# Patient Record
Sex: Female | Born: 1985 | Hispanic: No | Marital: Married | Smoking: Former smoker
Health system: Southern US, Community
[De-identification: ages and names within clinical notes are randomized; demographics above are authoritative.]

## PROBLEM LIST (undated history)

## (undated) DIAGNOSIS — K219 Gastro-esophageal reflux disease without esophagitis: Secondary | ICD-10-CM

## (undated) DIAGNOSIS — F419 Anxiety disorder, unspecified: Secondary | ICD-10-CM

## (undated) DIAGNOSIS — E119 Type 2 diabetes mellitus without complications: Secondary | ICD-10-CM

## (undated) DIAGNOSIS — F909 Attention-deficit hyperactivity disorder, unspecified type: Secondary | ICD-10-CM

## (undated) HISTORY — DX: Anxiety disorder, unspecified: F41.9

## (undated) HISTORY — PX: TUBAL LIGATION: SHX77

## (undated) HISTORY — DX: Attention-deficit hyperactivity disorder, unspecified type: F90.9

## (undated) HISTORY — DX: Gastro-esophageal reflux disease without esophagitis: K21.9

---

## 2012-05-02 HISTORY — PX: GASTRIC BYPASS: SHX52

## 2016-05-28 ENCOUNTER — Encounter: Payer: Self-pay | Admitting: *Deleted

## 2016-05-28 ENCOUNTER — Emergency Department
Admission: EM | Admit: 2016-05-28 | Discharge: 2016-05-29 | Disposition: A | Discharge status: Home / Self Care | Attending: Emergency Medicine | Admitting: Emergency Medicine

## 2016-05-28 DIAGNOSIS — R45851 Suicidal ideations: Secondary | ICD-10-CM

## 2016-05-28 DIAGNOSIS — F1910 Other psychoactive substance abuse, uncomplicated: Secondary | ICD-10-CM

## 2016-05-28 DIAGNOSIS — Z79899 Other long term (current) drug therapy: Secondary | ICD-10-CM | POA: Insufficient documentation

## 2016-05-28 DIAGNOSIS — R258 Other abnormal involuntary movements: Secondary | ICD-10-CM | POA: Diagnosis not present

## 2016-05-28 DIAGNOSIS — E119 Type 2 diabetes mellitus without complications: Secondary | ICD-10-CM | POA: Diagnosis not present

## 2016-05-28 HISTORY — DX: Type 2 diabetes mellitus without complications: E11.9

## 2016-05-28 LAB — COMPREHENSIVE METABOLIC PANEL
ALK PHOS: 50 U/L (ref 38–126)
ALT: 23 U/L (ref 14–54)
ANION GAP: 6 (ref 5–15)
AST: 25 U/L (ref 15–41)
Albumin: 4.6 g/dL (ref 3.5–5.0)
BILIRUBIN TOTAL: 0.4 mg/dL (ref 0.3–1.2)
BUN: 13 mg/dL (ref 6–20)
CALCIUM: 9 mg/dL (ref 8.9–10.3)
CO2: 27 mmol/L (ref 22–32)
Chloride: 104 mmol/L (ref 101–111)
Creatinine, Ser: 0.75 mg/dL (ref 0.44–1.00)
Glucose, Bld: 115 mg/dL — ABNORMAL HIGH (ref 65–99)
POTASSIUM: 3.5 mmol/L (ref 3.5–5.1)
Sodium: 137 mmol/L (ref 135–145)
TOTAL PROTEIN: 7.4 g/dL (ref 6.5–8.1)

## 2016-05-28 LAB — CBC
HCT: 40.2 % (ref 35.0–47.0)
HEMOGLOBIN: 13.5 g/dL (ref 12.0–16.0)
MCH: 30.5 pg (ref 26.0–34.0)
MCHC: 33.6 g/dL (ref 32.0–36.0)
MCV: 90.9 fL (ref 80.0–100.0)
PLATELETS: 181 10*3/uL (ref 150–440)
RBC: 4.43 MIL/uL (ref 3.80–5.20)
RDW: 13.1 % (ref 11.5–14.5)
WBC: 5.7 10*3/uL (ref 3.6–11.0)

## 2016-05-28 LAB — URINE DRUG SCREEN, QUALITATIVE (ARMC ONLY)
Amphetamines, Ur Screen: NOT DETECTED
BARBITURATES, UR SCREEN: NOT DETECTED
Benzodiazepine, Ur Scrn: POSITIVE — AB
CANNABINOID 50 NG, UR ~~LOC~~: NOT DETECTED
COCAINE METABOLITE, UR ~~LOC~~: NOT DETECTED
MDMA (ECSTASY) UR SCREEN: NOT DETECTED
METHADONE SCREEN, URINE: NOT DETECTED
Opiate, Ur Screen: POSITIVE — AB
Phencyclidine (PCP) Ur S: NOT DETECTED
TRICYCLIC, UR SCREEN: NOT DETECTED

## 2016-05-28 LAB — POCT PREGNANCY, URINE: PREG TEST UR: NEGATIVE

## 2016-05-28 LAB — ETHANOL
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
## 2016-05-28 NOTE — ED Notes (Signed)

## 2016-05-28 NOTE — ED Notes (Signed)
Report given to Ann, RN.
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
## 2016-05-28 NOTE — ED Notes (Signed)
Calvin, TTS at bedside at this time.  
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
## 2016-05-28 NOTE — ED Notes (Signed)
IVC'd by mother Carrier [phone]) mother came to ED, daughter did not want to see her. Officer did advise patient not have any visitors at this time.

## 2016-05-28 NOTE — ED Notes (Signed)

## 2016-05-28 NOTE — ED Notes (Signed)
Report was received from Dorise HissElizabeth C., RN; Pt. Verbalizes  complaints of being depressed and sad; and tearful;  Also verbalizes having S.I; with a plan to "slit her throat.". Denies having Hi. Continue to monitor with 15 min. Monitoring.

## 2016-05-28 NOTE — ED Notes (Signed)
Report called to Margaret, RN in ED BHU 

## 2016-05-28 NOTE — ED Provider Notes (Signed)
[facility] Emergency Department Provider Note        Time seen: ----------------------------------------- 5:44 PM on 05/28/2016 -----------------------------------------    I have reviewed the triage vital signs and the nursing notes.   HISTORY  Chief Complaint IVC    HPI Yolanda Mccarthy is a 31 y.o. female who presents to the ER under involuntary commitment.IVC paperwork states the patient was threatening to cut her throat but the patient is denying this. There is a history of drug abuse both in the family and with the patient. Patient reports family gave her clonazepam and hydrocodone today. Patient states she fell and sustained cuts to her right arm and right leg. Family states she was trying to hurt herself.   Past Medical History:  Diagnosis Date  . Diabetes mellitus without complication (HCC)     There are no active problems to display for this patient.   Past Surgical History:  Procedure Laterality Date  . TUBAL LIGATION      Allergies Patient has no allergy information on record.  Social History Social History  Substance Use Topics  . Smoking status: Not on file  . Smokeless tobacco: Not on file  . Alcohol use Not on file    Review of Systems Constitutional: Negative for fever. Cardiovascular: Negative for chest pain. Respiratory: Negative for shortness of breath. Gastrointestinal: Negative for abdominal pain, vomiting and diarrhea. Genitourinary: Negative for dysuria. Musculoskeletal: Negative for back pain. Skin: Positive for scratches to right arm and right leg Neurological: Negative for headaches, focal weakness or numbness. Psychiatric: Positive for drug abuse, negative for suicidal or homicidal ideations  10-point ROS otherwise negative.  ____________________________________________   PHYSICAL EXAM:  VITAL SIGNS: ED Triage Vitals [05/28/16 1708]  Enc Vitals Group     BP 129/86     Pulse Rate (!) 102   Resp 18     Temp 98.2 F (36.8 C)     Temp Source Oral     SpO2 100 %     Weight 145 lb (65.8 kg)     Height 5\' 4"  (1.626 m)     Head Circumference      Peak Flow      Pain Score      Pain Loc      Pain Edu?      Excl. in GC?     Constitutional: Alert and oriented. Well appearing and in no distress. Eyes: Conjunctivae are normal. PERRL. Normal extraocular movements. ENT   Head: Normocephalic and atraumatic.   Nose: No congestion/rhinnorhea.   Mouth/Throat: Mucous membranes are moist.   Neck: No stridor. Cardiovascular: Normal rate, regular rhythm. No murmurs, rubs, or gallops. Respiratory: Normal respiratory effort without tachypnea nor retractions. Breath sounds are clear and equal bilaterally. No wheezes/rales/rhonchi. Gastrointestinal: Soft and nontender. Normal bowel sounds Musculoskeletal: Nontender with normal range of motion in all extremities. No lower extremity tenderness nor edema. Neurologic:  Normal speech and language. No gross focal neurologic deficits are appreciated.  Skin:  Multiple long scratches are appreciated on the right arm anteriorly and right leg laterally on the thigh Psychiatric: Mood and affect are normal. ____________________________________________  ED COURSE:  Pertinent labs & imaging results that were available during my care of the patient were reviewed by me and considered in my medical decision making (see chart for details). Patient's no distress, we will assess with labs and consult psychiatry. Under involuntary commitment   Procedures ____________________________________________   LABS (pertinent positives/negatives)  Labs Reviewed  COMPREHENSIVE METABOLIC PANEL -  Abnormal; Notable for the following:       Result Value   Glucose, Bld 115 (*)    All other components within normal limits  URINE DRUG SCREEN, QUALITATIVE ([facility] ONLY) - Abnormal; Notable for the following:    Opiate, Ur Screen POSITIVE (*)    Benzodiazepine,  Ur Scrn POSITIVE (*)    All other components within normal limits  ETHANOL  CBC  POCT PREGNANCY, URINE   ____________________________________________  FINAL ASSESSMENT AND PLAN  Involuntary commitment, suicidal ideation, polysubstance abuse  Plan: Patient with labs as dictated above. Patient remains medically stable for psychiatric evaluation and disposition.   Emily Filbert, MD   Note: This note was generated in part or whole with voice recognition software. Voice recognition is usually quite accurate but there are transcription errors that can and very often do occur. I apologize for any typographical errors that were not detected and corrected.     Emily Filbert, MD 05/28/16 [phone]

## 2016-05-29 NOTE — ED Notes (Signed)
Patient having SOC at present.

## 2016-05-29 NOTE — ED Provider Notes (Signed)
-----------------------------------------   8:34 AM on 05/29/2016 -----------------------------------------   Blood pressure 108/73, pulse 90, temperature 98.1 F (36.7 C), temperature source Oral, resp. rate 18, height 5\' 4"  (1.626 m), weight 145 lb (65.8 kg), last menstrual period 04/23/2016, SpO2 100 %.  The patient had no acute events since last update.  Calm and cooperative at this time.  Disposition is pending Psychiatry/Behavioral Medicine team recommendations.     Rebecka ApleyAllison P Webster, MD 05/29/16 [phone]

## 2016-05-29 NOTE — ED Notes (Signed)
Patient visited with boyfriend. Visit went well. Patient anticipating discharge today. Maintained on 15 minute checks and observation by security camera for safety.

## 2016-05-29 NOTE — ED Notes (Signed)
Patient discharged ambulatory to self. She denies SI or HI. Discharge instructions reviewed with staff, she verbalizes understanding. Patient received copy of DC plan and all personal belongings. 

## 2016-05-29 NOTE — ED Notes (Signed)
Patient sitting in common area, in NAD. Maintained on 15 minute checks and observation by security camera for safety.

## 2016-05-29 NOTE — ED Notes (Signed)

## 2016-05-29 NOTE — ED Provider Notes (Signed)
-----------------------------------------   2:24 PM on 05/29/2016 -----------------------------------------  The patient has been seen by psychiatry on-call they believe the patient is safe for discharge home. Patient's medical workup has been largely nonrevealing besides opiate and benzodiazepine positive urine toxicology. Patient will be discharged home with RTS follow-up.   Minna AntisKevin Jameika Kinn, MD 05/29/16 [phone]
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
## 2016-05-29 NOTE — ED Notes (Signed)
Patient in common area, in NAD. Spoke with boyfriend on the phone.

## 2016-05-29 NOTE — ED Notes (Signed)
Patient resting quietly in room. No noted distress or abnormal behaviors noted. Will continue 15 minute checks and observation by security camera for safety. 

## 2016-05-29 NOTE — Discharge Instructions (Signed)
You have been seen in the emergency department for a  psychiatric concern. You have been evaluated both medically as well as psychiatrically. Please follow-up with your outpatient resources provided. Return to the emergency department for any worsening symptoms, or any thoughts of hurting yourself or anyone else so that we may attempt to help you. 

## 2016-09-12 ENCOUNTER — Encounter: Payer: Self-pay | Admitting: Family Medicine

## 2016-09-12 ENCOUNTER — Ambulatory Visit (INDEPENDENT_AMBULATORY_CARE_PROVIDER_SITE_OTHER): Admitting: Family Medicine

## 2016-09-12 VITALS — BP 120/82 | HR 88 | Ht 64.0 in | Wt 138.0 lb

## 2016-09-12 DIAGNOSIS — R600 Localized edema: Secondary | ICD-10-CM | POA: Diagnosis not present

## 2016-09-12 DIAGNOSIS — Z7689 Persons encountering health services in other specified circumstances: Secondary | ICD-10-CM

## 2016-09-12 DIAGNOSIS — L237 Allergic contact dermatitis due to plants, except food: Secondary | ICD-10-CM

## 2016-09-12 DIAGNOSIS — R030 Elevated blood-pressure reading, without diagnosis of hypertension: Secondary | ICD-10-CM | POA: Diagnosis not present

## 2016-09-12 DIAGNOSIS — R102 Pelvic and perineal pain: Secondary | ICD-10-CM

## 2016-09-12 DIAGNOSIS — G8929 Other chronic pain: Secondary | ICD-10-CM

## 2016-09-12 MED ORDER — PREDNISONE 10 MG PO TABS: ORAL_TABLET | ORAL | 1 refills | Status: AC

## 2016-09-12 NOTE — Patient Instructions (Signed)

## 2016-09-12 NOTE — Progress Notes (Signed)
Name: Yolanda Mccarthy   MRN: [id number]    DOB: 16-Jan-1986   Date:09/12/2016       Progress Note  Subjective  Chief Complaint  Chief Complaint  Patient presents with  . Establish Care    transferred from [facility] to [facility] because it is closer to [place]  . Hypertension    never been dx with hypertension but has had some high readings recently- 150/ 90  . Rash    poison ivey around ankles, hands, arms- "weeding in yard"    Edema/ in am/pm... Not alleviated by elevation... Purple ... Swelling 2-3 x aday... No relief with Diurex   Hypertension  This is a new problem. The current episode started more than 1 month ago. The problem has been waxing and waning since onset. Associated symptoms include peripheral edema. Pertinent negatives include no anxiety, blurred vision, chest pain, headaches, malaise/fatigue, neck pain, orthopnea, palpitations, PND, shortness of breath or sweats. Agents associated with hypertension include amphetamines. Past treatments include lifestyle changes (decrease sodium). The current treatment provides moderate improvement. There are no compliance problems.  There is no history of angina, kidney disease, CAD/MI, CVA, heart failure, left ventricular hypertrophy, PVD or retinopathy. There is no history of chronic renal disease, coarctation of the aorta, a hypertension causing med, renovascular disease or a thyroid problem.  Rash  This is a new problem. The current episode started 1 to 4 weeks ago. The problem has been waxing and waning since onset. The affected locations include the left ankle, right hand, right wrist, left wrist, left hand and right ankle. The rash is characterized by itchiness. She was exposed to plant contact. Pertinent negatives include no anorexia, congestion, cough, diarrhea, eye pain, facial edema, fatigue, fever, joint pain, nail changes, rhinorrhea, shortness of breath, sore throat or vomiting. Past treatments include topical steroids and anti-itch  cream. The treatment provided mild relief. There is no history of allergies, asthma, eczema or varicella.    No problem-specific Assessment & Plan notes found for this encounter.   Past Medical History:  Diagnosis Date  . Anxiety   . Attention deficit hyperactivity disorder (ADHD)   . Diabetes mellitus without complication (HCC)   . GERD (gastroesophageal reflux disease)     Past Surgical History:  Procedure Laterality Date  . CESAREAN SECTION     x 2  . GASTRIC BYPASS  2014  . TUBAL LIGATION      Family History  Problem Relation Age of Onset  . Diabetes Mother   . Stroke Mother   . Diabetes Father   . Hypertension Father   . Cancer Sister   . Diabetes Sister   . Hypertension Sister   . Diabetes Brother   . Cancer Maternal Grandmother   . Diabetes Maternal Grandmother   . Hypertension Maternal Grandmother   . Diabetes Maternal Grandfather   . Diabetes Paternal Grandmother   . Diabetes Paternal Grandfather     Social History   Social History  . Marital status: Married    Spouse name: N/A  . Number of children: N/A  . Years of education: N/A   Occupational History  . Not on file.   Social History Main Topics  . Smoking status: Former Smoker    Types: Cigarettes  . Smokeless tobacco: Never Used  . Alcohol use Yes  . Drug use: No  . Sexual activity: No   Other Topics Concern  . Not on file   Social History Narrative  . No narrative on file  Allergies  Allergen Reactions  . Oxycodone     sedation  . Tramadol     Causes seizures    Outpatient Medications Prior to Visit  Medication Sig Dispense Refill  . ondansetron (ZOFRAN) 8 MG tablet Take by mouth every 8 (eight) hours as needed for nausea or vomiting. Gastric bypass surgery    . pantoprazole (PROTONIX) 40 MG tablet Take 40 mg by mouth 2 (two) times daily.    . promethazine (PHENERGAN) 25 MG tablet Take 25 mg by mouth every 6 (six) hours as needed for nausea or vomiting.     No  facility-administered medications prior to visit.     Review of Systems  Constitutional: Negative for chills, diaphoresis, fatigue, fever, malaise/fatigue and weight loss.  HENT: Negative for congestion, ear discharge, ear pain, rhinorrhea and sore throat.   Eyes: Negative for blurred vision and pain.  Respiratory: Negative for cough, sputum production, shortness of breath and wheezing.   Cardiovascular: Positive for leg swelling. Negative for chest pain, palpitations, orthopnea and PND.  Gastrointestinal: Negative for abdominal pain, anorexia, blood in stool, constipation, diarrhea, heartburn, melena, nausea and vomiting.  Genitourinary: Negative for dysuria, frequency, hematuria and urgency.  Musculoskeletal: Negative for back pain, joint pain, myalgias and neck pain.  Skin: Positive for itching and rash. Negative for nail changes.  Neurological: Negative for dizziness, tingling, sensory change, focal weakness, weakness and headaches.  Endo/Heme/Allergies: Negative for environmental allergies and polydipsia. Does not bruise/bleed easily.  Psychiatric/Behavioral: Negative for depression and suicidal ideas. The patient is not nervous/anxious and does not have insomnia.      Objective  Vitals:   09/12/16 1400  BP: 120/82  Pulse: 88  Weight: 138 lb (62.6 kg)  Height: 5\' 4"  (1.626 m)    Physical Exam  Constitutional: She is well-developed, well-nourished, and in no distress. No distress.  HENT:  Head: Normocephalic and atraumatic.  Right Ear: External ear normal.  Left Ear: External ear normal.  Nose: Nose normal.  Mouth/Throat: Oropharynx is clear and moist.  Eyes: Conjunctivae and EOM are normal. Pupils are equal, round, and reactive to light. Right eye exhibits no discharge. Left eye exhibits no discharge.  Neck: Normal range of motion. Neck supple. No JVD present. No thyromegaly present.  Cardiovascular: Normal rate, regular rhythm, normal heart sounds and intact distal  pulses.  Exam reveals no gallop and no friction rub.   No murmur heard. Pulmonary/Chest: Effort normal and breath sounds normal. She has no wheezes. She has no rales.  Abdominal: Soft. Bowel sounds are normal. She exhibits no mass. There is no tenderness. There is no guarding.  Musculoskeletal: Normal range of motion. She exhibits no edema or deformity.  Lymphadenopathy:    She has no cervical adenopathy.  Neurological: She is alert. She has normal reflexes.  Skin: Skin is warm and dry. Rash noted. She is not diaphoretic. No erythema. No pallor.  Psychiatric: Mood and affect normal.  Nursing note and vitals reviewed.     Assessment & Plan  Problem List Items Addressed This Visit    None    Visit Diagnoses    Establishing care with new doctor, encounter for    -  Primary   Allergic contact dermatitis due to plants, except food       Chronic pelvic pain in female       Relevant Medications   clonazePAM (KLONOPIN) 1 MG tablet   predniSONE (DELTASONE) 10 MG tablet   Other Relevant Orders   US Transvaginal Non-OB  Localized edema       Elevated blood pressure reading       Relevant Orders   US Pelvis Complete   US Abdomen Complete      Meds ordered this encounter  Medications  . predniSONE (DELTASONE) 10 MG tablet    Sig: Taper 6,6,6,5,5,5,4,4,3,3,2,2,1,1    Dispense:  53 tablet    Refill:  1  I spent 30 minutes with this patient, More than 50% of that time was spent in face to face education, counseling and care coordination.    Dr. Hayden Rasmusseneanna Keandra [facility]  09/12/16

## 2016-09-20 ENCOUNTER — Ambulatory Visit
Admission: RE | Admit: 2016-09-20 | Discharge: 2016-09-20 | Disposition: A | Discharge status: Home / Self Care | Source: Ambulatory Visit | Attending: Family Medicine | Admitting: Family Medicine

## 2016-09-20 ENCOUNTER — Other Ambulatory Visit: Payer: Self-pay

## 2016-09-20 DIAGNOSIS — R030 Elevated blood-pressure reading, without diagnosis of hypertension: Secondary | ICD-10-CM | POA: Diagnosis not present

## 2016-09-20 DIAGNOSIS — R102 Pelvic and perineal pain: Secondary | ICD-10-CM | POA: Diagnosis present

## 2016-09-20 DIAGNOSIS — R938 Abnormal findings on diagnostic imaging of other specified body structures: Secondary | ICD-10-CM | POA: Diagnosis not present

## 2016-09-20 DIAGNOSIS — D739 Disease of spleen, unspecified: Secondary | ICD-10-CM | POA: Diagnosis not present

## 2016-09-20 DIAGNOSIS — N839 Noninflammatory disorder of ovary, fallopian tube and broad ligament, unspecified: Secondary | ICD-10-CM | POA: Insufficient documentation

## 2016-09-20 DIAGNOSIS — G8929 Other chronic pain: Secondary | ICD-10-CM | POA: Diagnosis not present

## 2016-09-22 ENCOUNTER — Encounter: Payer: Self-pay | Admitting: Family Medicine

## 2016-10-24 ENCOUNTER — Ambulatory Visit: Admitting: Family Medicine

## 2017-06-29 IMAGING — US US TRANSVAGINAL NON-OB
1 series · 13 of 25 positions shown · non-contrast
Comparison: No recent prior.

CLINICAL DATA: Chronic abdominal and pelvic pain.

EXAM:
TRANSABDOMINAL AND TRANSVAGINAL ULTRASOUND OF PELVIS
TECHNIQUE: Both transabdominal and transvaginal ultrasound examinations of the
pelvis were performed. Transabdominal technique was performed for
global imaging of the pelvis including uterus, ovaries, adnexal
regions, and pelvic cul-de-sac. It was necessary to proceed with
endovaginal exam following the transabdominal exam to visualize the
uterus and ovaries.

[Series 1: us transvaginal non-ob · 0.20mm/px · 13 of 130 slices shown]
[im 1/130]
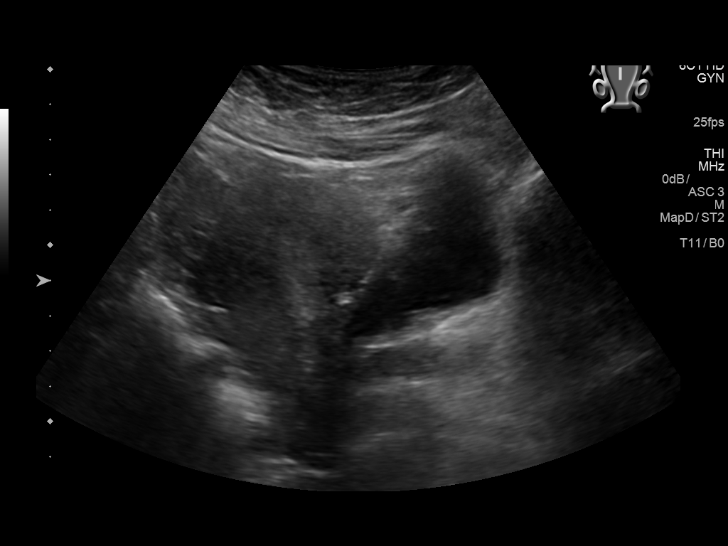
[im 11/130]
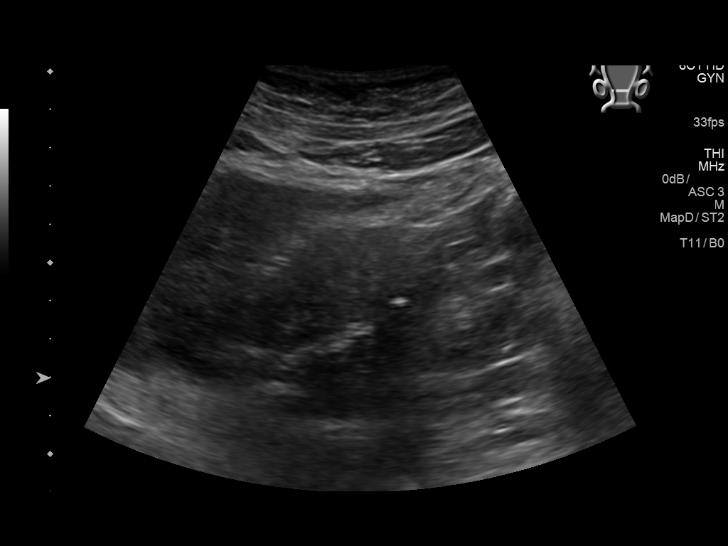
[im 22/130]
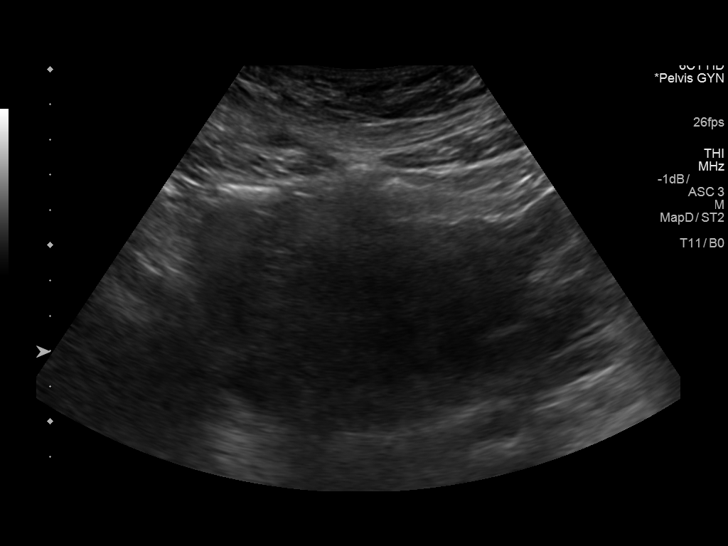
[im 33/130]
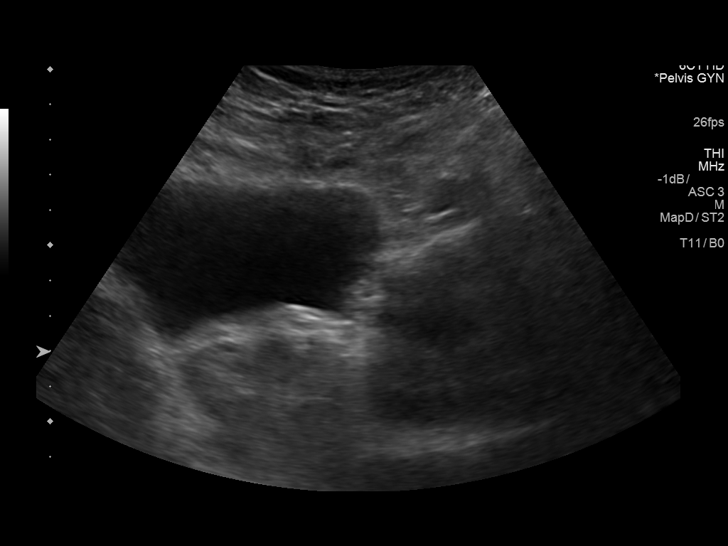
[im 44/130]
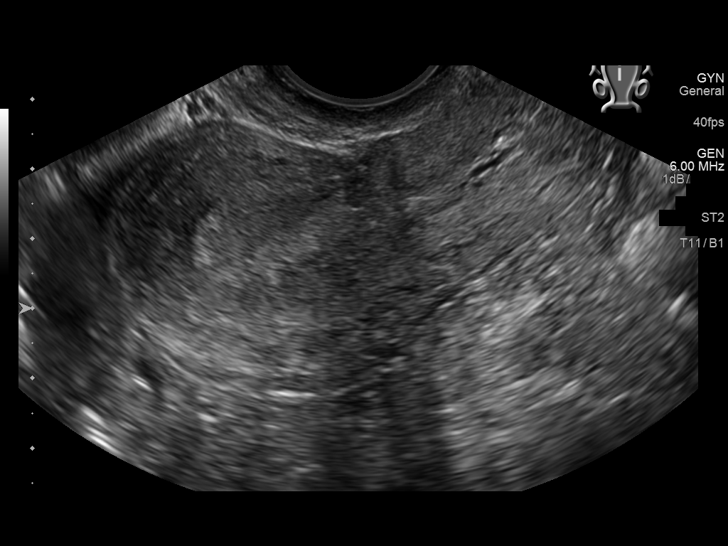
[im 54/130]
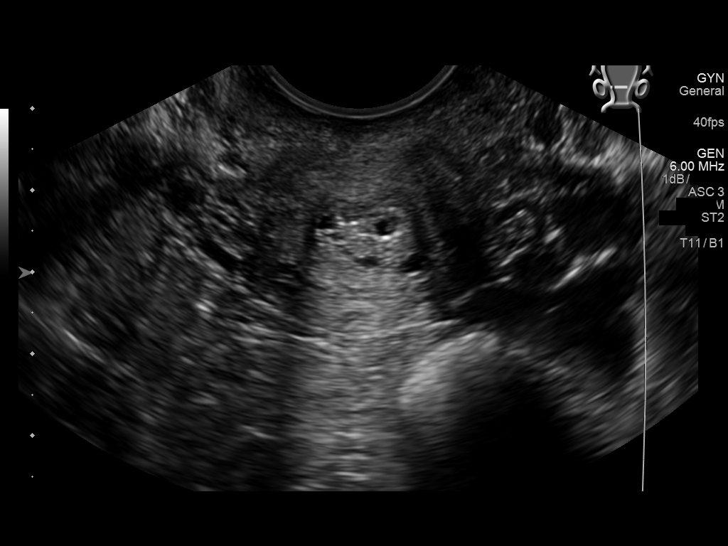
[im 65/130]
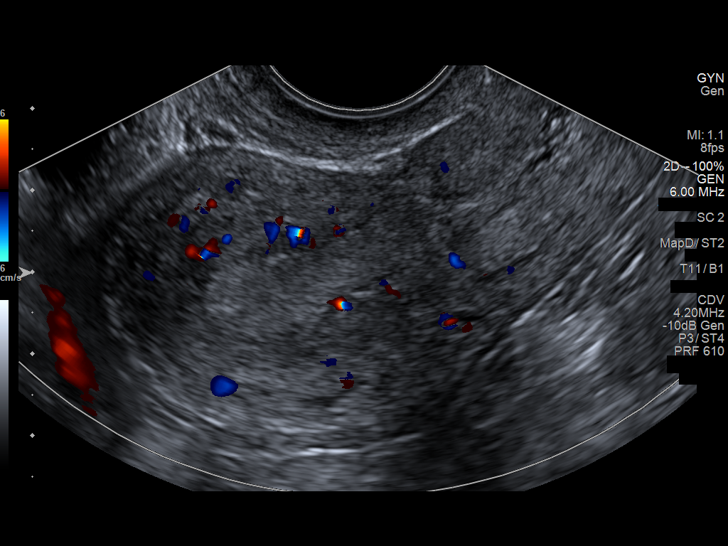
[im 76/130]
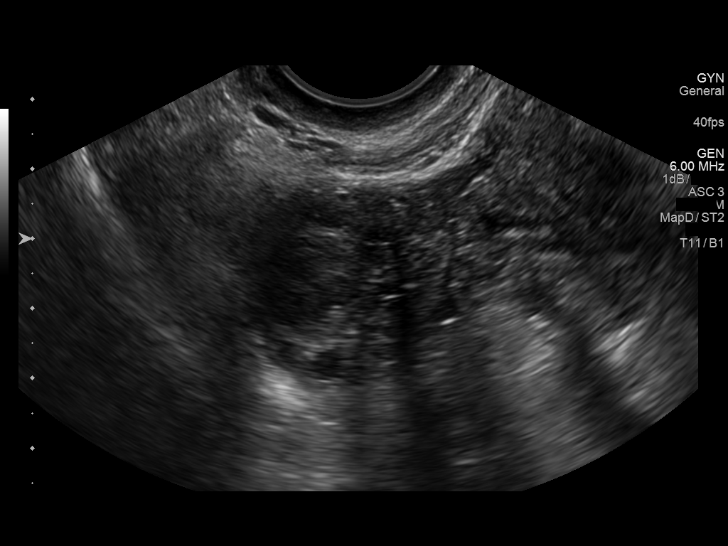
[im 87/130]
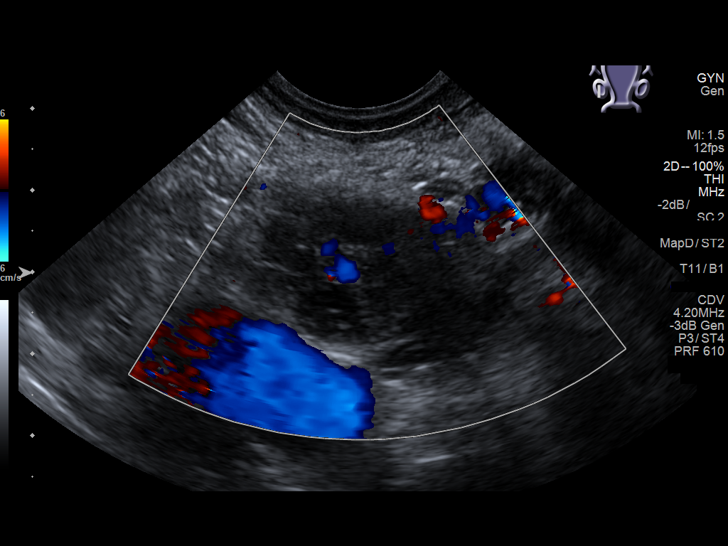
[im 97/130]
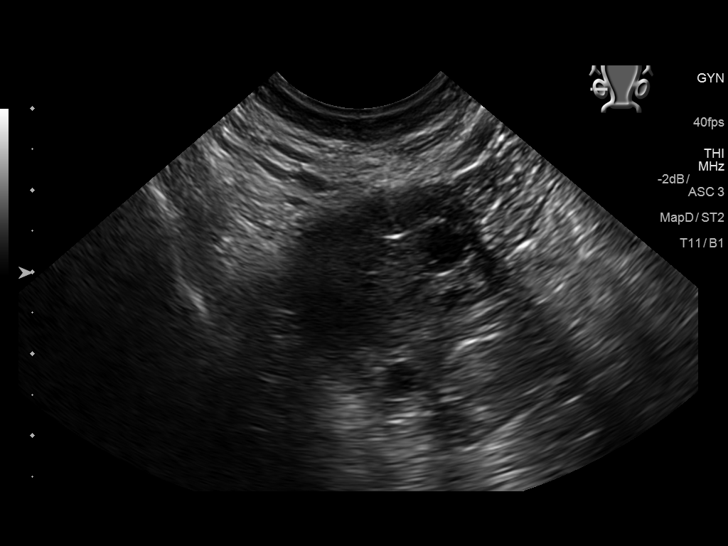
[im 108/130]
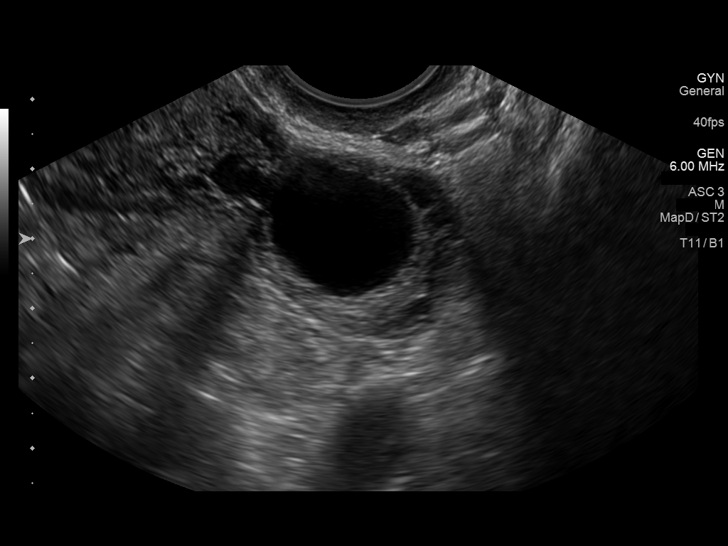
[im 119/130]
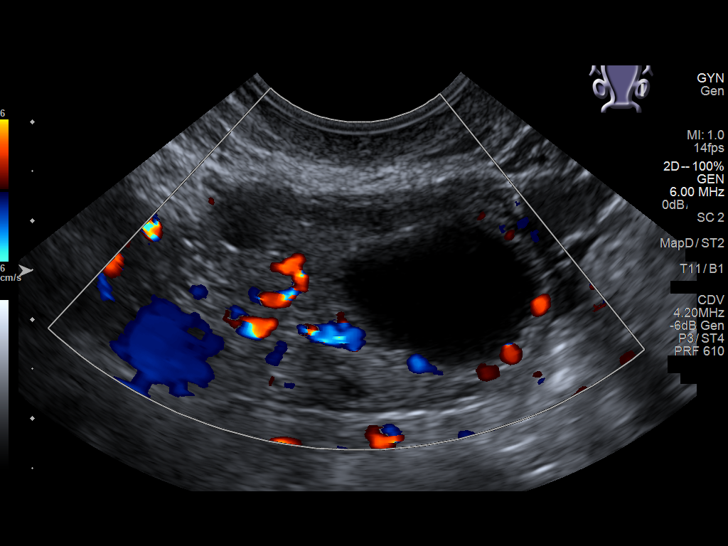
[im 130/130]
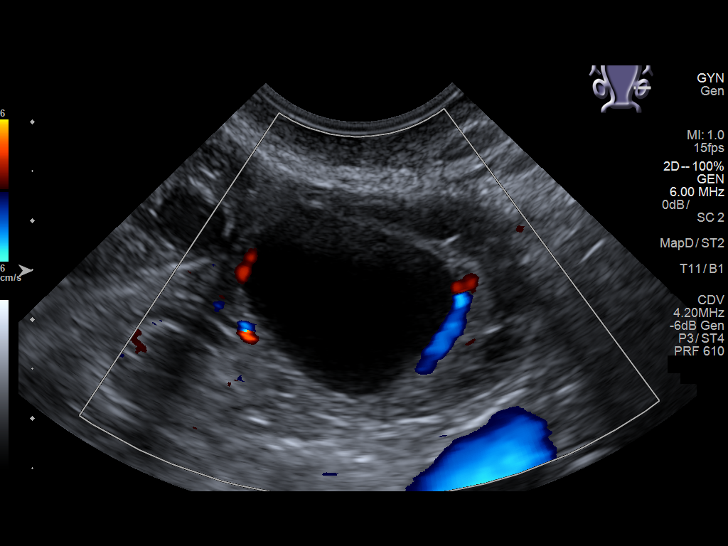

[13 of 25 positions shown; findings below may reference images not displayed]

FINDINGS: Uterus

Measurements: 7.6 x 3.4 x 4.8 cm. 0.8 x 0.5 x 0.4 cm cystic
structure/fluid collection noted in the lower uterine segment within
or abutting the endometrial canal.

Endometrium

Thickness: 11 mm.  No focal abnormality visualized.

Right ovary

Measurements: 3.6 x 2.6 x 2.7 cm. A 1.7 x 1.4 x 1.7 cm complex
lesion right ovary. Possibly hemorrhagic cyst. Other ovarian
pathology including a solid tumor or ectopic pregnancy cannot be
completely excluded .

Left ovary

Measurements: 3.6 x 2.3 x 2.9 cm. Dominant 2.4 x 1.9 x 2.4 cm
follicular cyst.

Other findings

Trace free pelvic fluid .
IMPRESSION: 1. A 1.7 x 1.4 x 1.7 cm complex lesion noted in the right ovary.
Possibly hemorrhagic cyst. Other ovarian pathology including
endometrioma, a solid tumor, and ectopic pregnancy cannot be
completely excluded. Pregnancy test is suggested for further
evaluation. Short-interval follow up ultrasound in 6-12 weeks is
recommended, preferably during the week following the patient's
normal menses.

2. 0.8 x 0.5 x 0.4 cm cystic structure/fluid collection in the lower
uterine segment within or abutting the endometrial canal. Again
pregnancy test suggested for further evaluation. This should be
followed on subsequent exam.

3.  Trace free pelvic fluid.

## 2017-06-29 IMAGING — US US ABDOMEN COMPLETE
1 series · 14 of 25 positions shown · non-contrast
Comparison: None.

CLINICAL DATA: Chronic abdominal and pelvic pain for 3 years, worse
in the past 6 months.

EXAM:
ABDOMEN ULTRASOUND COMPLETE

[Series 1: us abdomen complete · 0.19mm/px · 14 of 139 slices shown]
[im 1/139]
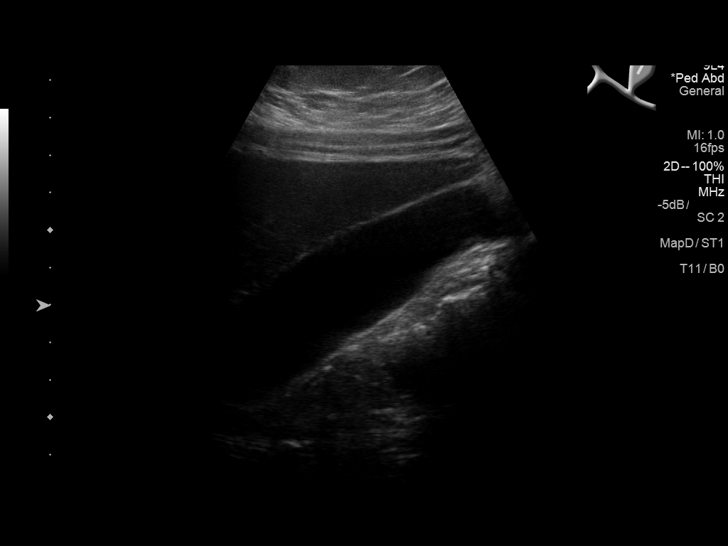
[im 12/139]
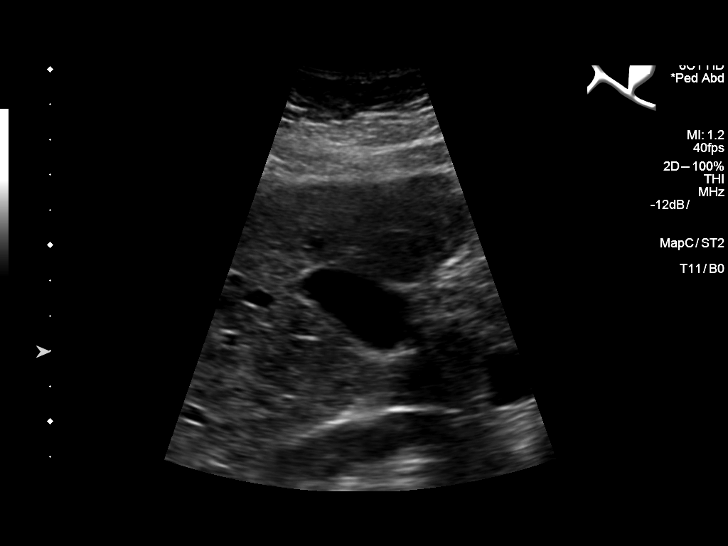
[im 24/139]
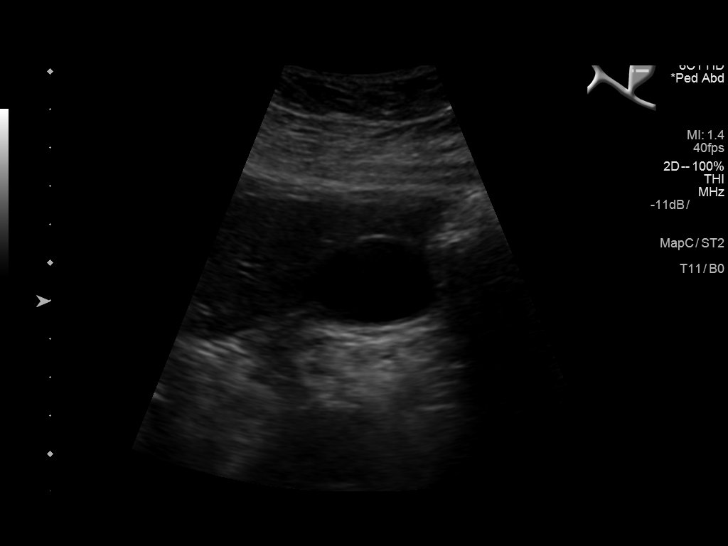
[im 35/139]
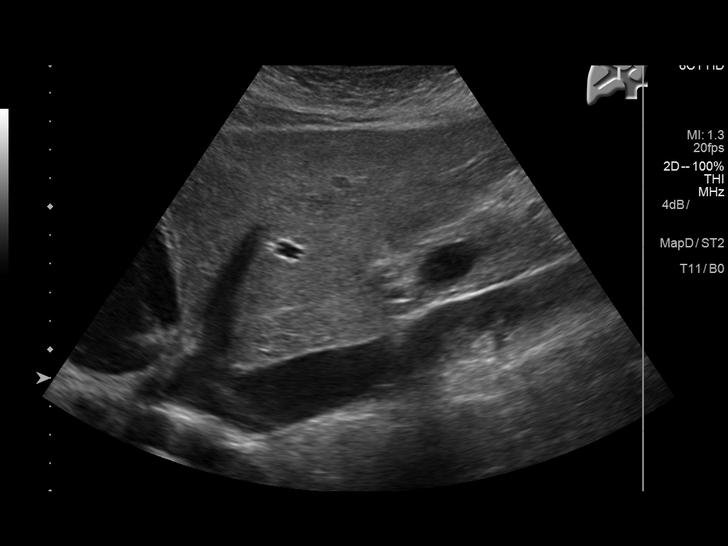
[im 47/139]
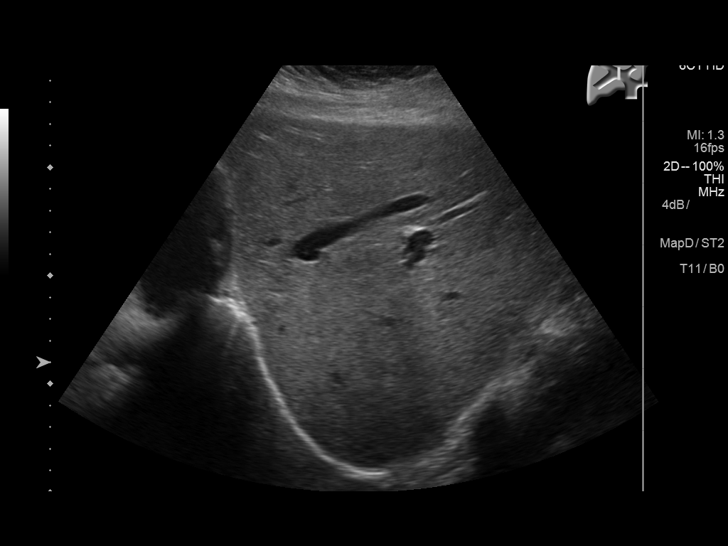
[im 52/139]
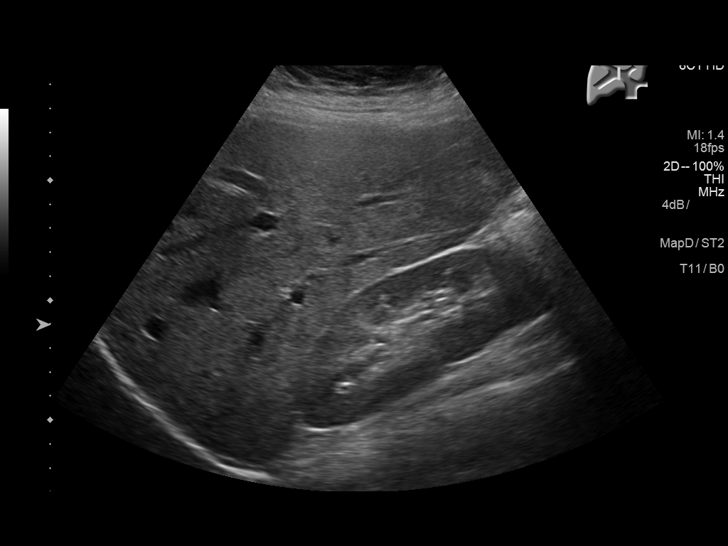
[im 64/139]
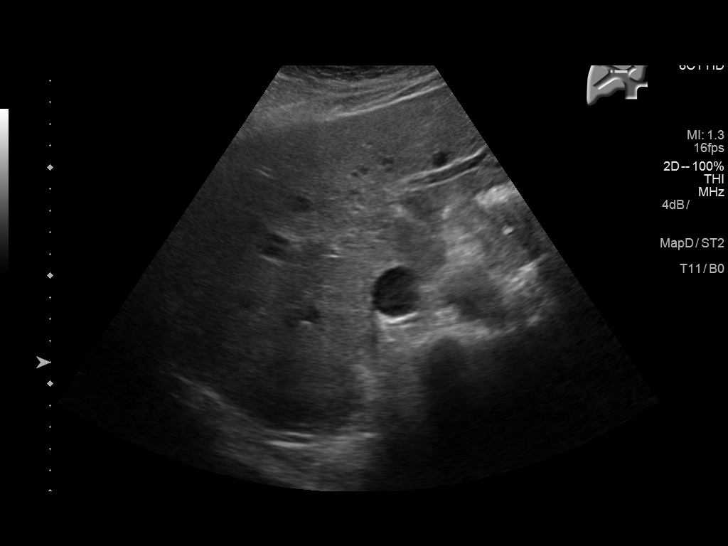
[im 75/139]
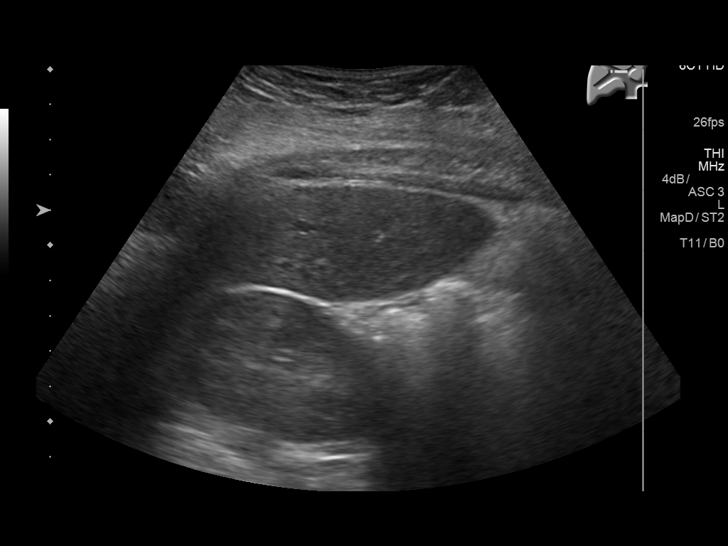
[im 87/139]
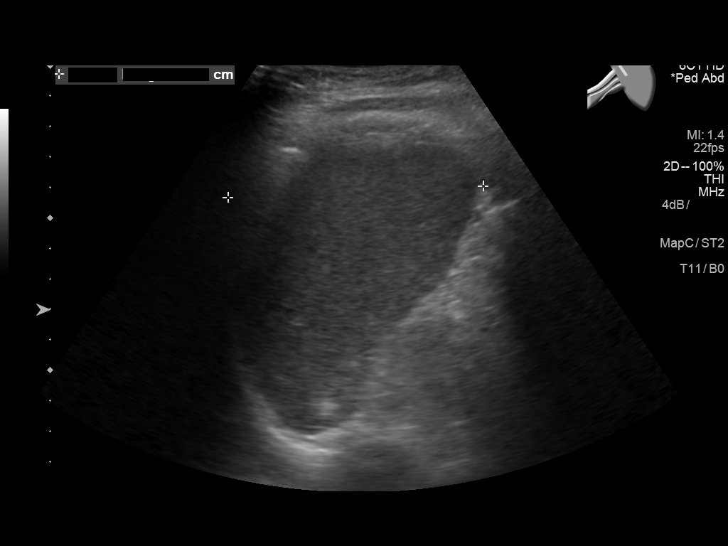
[im 93/139]
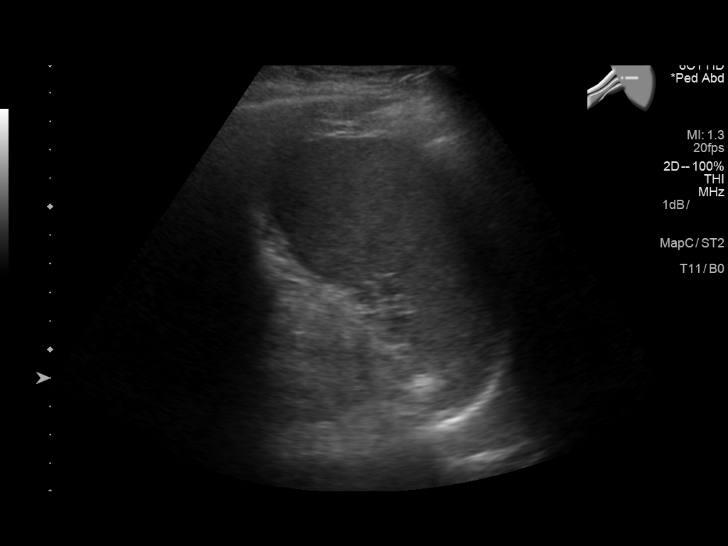
[im 104/139]
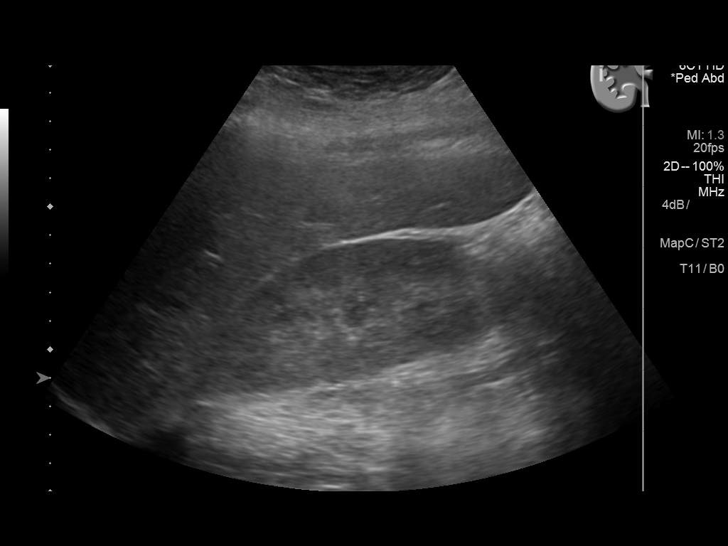
[im 116/139]
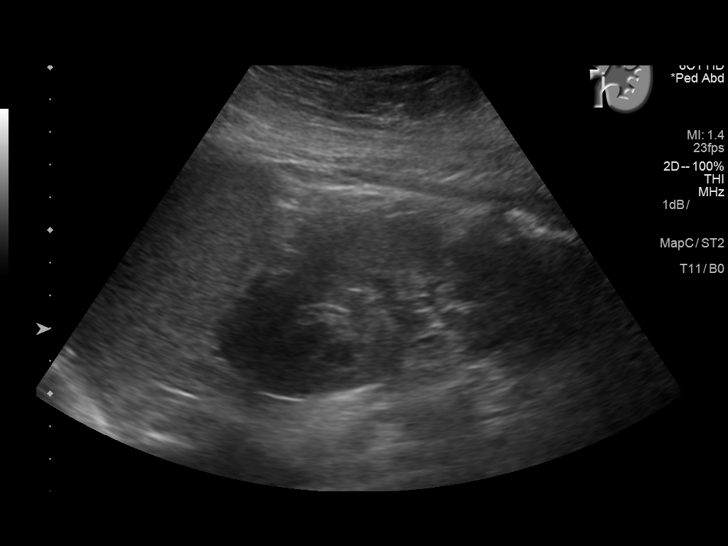
[im 127/139]
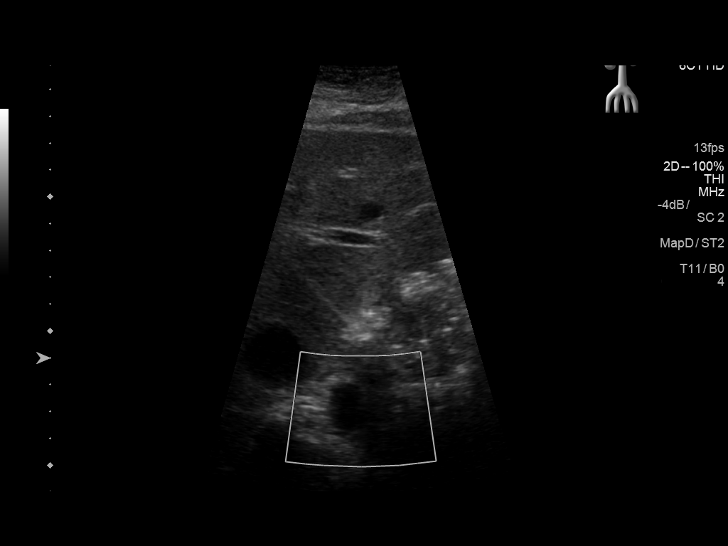
[im 139/139]
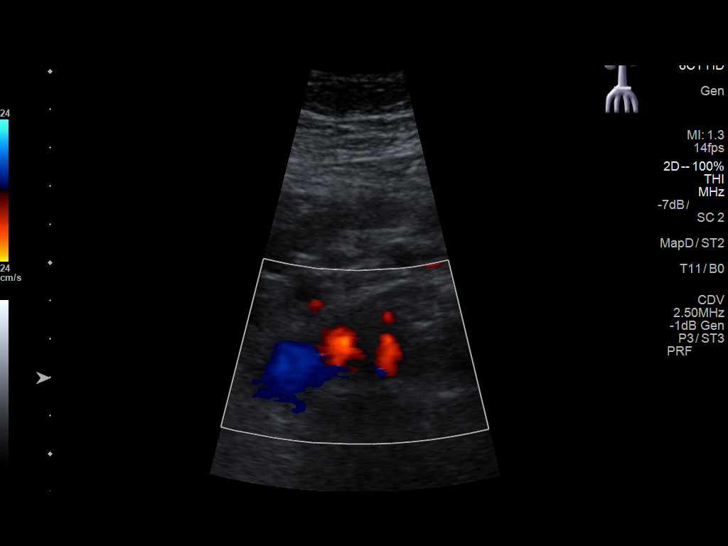

[14 of 25 positions shown; findings below may reference images not displayed]

FINDINGS: Gallbladder: No gallstones or wall thickening visualized. No
sonographic Murphy sign noted by sonographer.

Common bile duct: Diameter: 5 mm

Liver: No focal lesion identified. Within normal limits in
parenchymal echogenicity.

IVC: No abnormality visualized.

Pancreas: Visualized portion unremarkable.

Spleen: Within normal limits in size. 12 mm hyperechoic focus
peripherally in the spleen without evidence of increased internal
vascularity on color Doppler imaging.

Right Kidney: Length: 12.0 cm. Echogenicity within normal limits. No
mass or hydronephrosis visualized.

Left Kidney: Length: 12.3 cm. Echogenicity within normal limits. No
mass or hydronephrosis visualized.

Abdominal aorta: No aneurysm visualized.

Other findings: None.
IMPRESSION: 1. No acute abnormality or cause of abdominal pain identified.
2. 12 mm echogenic lesion in the spleen, nonspecific but likely
benign.
